# Patient Record
Sex: Female | Born: 1945 | Race: White | Hispanic: No | Smoking: Former smoker
Health system: Southern US, Community
[De-identification: ages and names within clinical notes are randomized; demographics above are authoritative.]

## PROBLEM LIST (undated history)

## (undated) DIAGNOSIS — R609 Edema, unspecified: Secondary | ICD-10-CM

## (undated) DIAGNOSIS — F0390 Unspecified dementia without behavioral disturbance: Secondary | ICD-10-CM

## (undated) DIAGNOSIS — Z8719 Personal history of other diseases of the digestive system: Secondary | ICD-10-CM

## (undated) DIAGNOSIS — M810 Age-related osteoporosis without current pathological fracture: Secondary | ICD-10-CM

## (undated) DIAGNOSIS — Z860100 Personal history of colon polyps, unspecified: Secondary | ICD-10-CM

## (undated) DIAGNOSIS — E039 Hypothyroidism, unspecified: Secondary | ICD-10-CM

## (undated) DIAGNOSIS — F329 Major depressive disorder, single episode, unspecified: Secondary | ICD-10-CM

## (undated) DIAGNOSIS — E785 Hyperlipidemia, unspecified: Secondary | ICD-10-CM

## (undated) DIAGNOSIS — Z733 Stress, not elsewhere classified: Secondary | ICD-10-CM

## (undated) DIAGNOSIS — F3289 Other specified depressive episodes: Secondary | ICD-10-CM

## (undated) HISTORY — DX: Major depressive disorder, single episode, unspecified: F32.9

## (undated) HISTORY — PX: TONSILLECTOMY: SUR1361

## (undated) HISTORY — DX: Other specified depressive episodes: F32.89

## (undated) HISTORY — DX: Personal history of other diseases of the digestive system: Z87.19

## (undated) HISTORY — PX: TUBAL LIGATION: SHX77

## (undated) HISTORY — DX: Edema, unspecified: R60.9

## (undated) HISTORY — DX: Stress, not elsewhere classified: Z73.3

## (undated) HISTORY — DX: Personal history of colon polyps, unspecified: Z86.0100

## (undated) HISTORY — DX: Hypothyroidism, unspecified: E03.9

## (undated) HISTORY — DX: Age-related osteoporosis without current pathological fracture: M81.0

## (undated) HISTORY — PX: HERNIA REPAIR: SHX51

## (undated) HISTORY — DX: Personal history of colonic polyps: Z86.010

## (undated) HISTORY — DX: Hyperlipidemia, unspecified: E78.5

---

## 2004-10-19 ENCOUNTER — Ambulatory Visit: Payer: Self-pay

## 2004-12-07 ENCOUNTER — Ambulatory Visit: Payer: Self-pay

## 2005-08-04 ENCOUNTER — Ambulatory Visit: Payer: Self-pay | Admitting: Unknown Physician Specialty

## 2006-01-24 ENCOUNTER — Ambulatory Visit: Payer: Self-pay

## 2007-05-13 ENCOUNTER — Ambulatory Visit: Payer: Self-pay

## 2008-06-03 ENCOUNTER — Ambulatory Visit: Payer: Self-pay

## 2009-02-07 ENCOUNTER — Ambulatory Visit: Payer: Self-pay | Admitting: Unknown Physician Specialty

## 2011-08-15 ENCOUNTER — Encounter: Payer: Self-pay | Admitting: *Deleted

## 2011-08-16 ENCOUNTER — Encounter: Payer: Self-pay | Admitting: Cardiovascular Disease

## 2011-08-16 ENCOUNTER — Ambulatory Visit (INDEPENDENT_AMBULATORY_CARE_PROVIDER_SITE_OTHER): Payer: PRIVATE HEALTH INSURANCE | Admitting: Cardiovascular Disease

## 2011-08-16 DIAGNOSIS — R079 Chest pain, unspecified: Secondary | ICD-10-CM | POA: Insufficient documentation

## 2011-08-16 DIAGNOSIS — I498 Other specified cardiac arrhythmias: Secondary | ICD-10-CM

## 2011-08-16 DIAGNOSIS — E785 Hyperlipidemia, unspecified: Secondary | ICD-10-CM | POA: Insufficient documentation

## 2011-08-16 DIAGNOSIS — R001 Bradycardia, unspecified: Secondary | ICD-10-CM | POA: Insufficient documentation
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
## 2011-08-16 NOTE — Assessment & Plan Note (Signed)
Heart rate was low on EKG with rate 47 beats per minute. She is asymptomatic. No further workup needed for bradycardia. Rate did improve with movement and sitting up.

## 2011-08-16 NOTE — Assessment & Plan Note (Signed)
Chest pain is atypical in nature. She is very active otherwise with no reproducible symptoms. We have offered routine treadmill study. With the Easter weekend coming up, she will call us if she has additional symptoms and would like the treadmill test. We have suggested if her symptoms resolve and she is active with no further symptoms, she could call us if they recur and we would order this study at her convenience.   Otherwise EKG is normal apart from bradycardia, clinical exam is benign. No echocardiogram has been ordered as she is otherwise doing well. We have encouraged her to continue her regular exercise for weight loss.

## 2011-08-16 NOTE — Patient Instructions (Addendum)
You are doing well. No medication changes were made.  Please call the office if you have worsening chest pain. We would do a treadmill ekg study  Please call us if you have new issues that need to be addressed before your next appt.  Your physician wants you to follow-up in: 12 months.  You will receive a reminder letter in the mail two months in advance. If you don't receive a letter, please call our office to schedule the follow-up appointment.

## 2011-08-16 NOTE — Assessment & Plan Note (Signed)
We have encouraged her to continue on her pravastatin, increase her exercise for weight loss.

## 2011-09-18 ENCOUNTER — Encounter: Payer: Self-pay | Admitting: Cardiovascular Disease

## 2012-09-04 ENCOUNTER — Encounter: Payer: Self-pay | Admitting: Cardiovascular Disease

## 2012-09-04 ENCOUNTER — Ambulatory Visit (INDEPENDENT_AMBULATORY_CARE_PROVIDER_SITE_OTHER): Payer: BC Managed Care – PPO | Admitting: Cardiovascular Disease

## 2012-09-04 VITALS — BP 118/70 | HR 55 | Ht 66.0 in | Wt 179.2 lb

## 2012-09-04 DIAGNOSIS — R079 Chest pain, unspecified: Secondary | ICD-10-CM

## 2012-09-04 DIAGNOSIS — R001 Bradycardia, unspecified: Secondary | ICD-10-CM

## 2012-09-04 DIAGNOSIS — I498 Other specified cardiac arrhythmias: Secondary | ICD-10-CM

## 2012-09-04 DIAGNOSIS — E785 Hyperlipidemia, unspecified: Secondary | ICD-10-CM

## 2012-09-04 NOTE — Patient Instructions (Addendum)
You are doing well. No medication changes were made.  Please call if you have any symptoms such as worsening chest pain,  concerning for cardiac disease  Please call us if you have new issues that need to be addressed before your next appt.
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
## 2012-09-04 NOTE — Assessment & Plan Note (Addendum)
Atypical with markedly reduced frequency. Noted to be positional and self-limiting shortly after waking in the mornings. These episodes are now rare. She is still active around the house and able to complete her ADLs without incident. There are nonspecific ST changes on EKG which were present 1 year ago. Not interested in ETT. Will continue to monitor. She will call the office if chest pain becomes more frequent or worsens. Continue low-dose ASA and statin. She denies reflux-type symptoms. Agreed that stopping antacids or only taking as needed for indigestion is appropriate. She will continue to monitor her symptoms.

## 2012-09-04 NOTE — Assessment & Plan Note (Addendum)
Continues to take pravastatin 40mg  with good lipid control. Would like to receive last lipid panel drawn at PCP's office to review. She is tolerating this medication well. If cholesterol is as low as she believes it is, approximately 130, perhaps she could take half her pravastatin given no known coronary artery disease.

## 2012-09-04 NOTE — Assessment & Plan Note (Addendum)
Asymptomatic with rate 50s in the office today. Stable since last visit.
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
## 2012-09-05 ENCOUNTER — Ambulatory Visit: Payer: Self-pay | Admitting: Psychiatry

## 2012-09-05 LAB — CREATININE, SERUM
EGFR (African American): >60
EGFR (Non-African Amer.): >60

## 2017-04-05 ENCOUNTER — Other Ambulatory Visit: Payer: Self-pay | Admitting: Family Medicine

## 2019-03-26 ENCOUNTER — Emergency Department: Payer: Medicare Other

## 2019-03-26 ENCOUNTER — Other Ambulatory Visit: Payer: Self-pay

## 2019-03-26 ENCOUNTER — Emergency Department
Admission: EM | Admit: 2019-03-26 | Discharge: 2019-03-27 | Disposition: A | Discharge status: Home / Self Care | Payer: Medicare Other | Attending: Emergency Medicine | Admitting: Emergency Medicine

## 2019-03-26 DIAGNOSIS — Z20828 Contact with and (suspected) exposure to other viral communicable diseases: Secondary | ICD-10-CM | POA: Diagnosis not present

## 2019-03-26 DIAGNOSIS — R001 Bradycardia, unspecified: Secondary | ICD-10-CM | POA: Diagnosis present

## 2019-03-26 DIAGNOSIS — Z79899 Other long term (current) drug therapy: Secondary | ICD-10-CM | POA: Diagnosis not present

## 2019-03-26 DIAGNOSIS — F0390 Unspecified dementia without behavioral disturbance: Secondary | ICD-10-CM | POA: Diagnosis present

## 2019-03-26 DIAGNOSIS — Z7982 Long term (current) use of aspirin: Secondary | ICD-10-CM | POA: Insufficient documentation

## 2019-03-26 DIAGNOSIS — Z87891 Personal history of nicotine dependence: Secondary | ICD-10-CM | POA: Diagnosis not present

## 2019-03-26 DIAGNOSIS — R4182 Altered mental status, unspecified: Secondary | ICD-10-CM | POA: Diagnosis present

## 2019-03-26 DIAGNOSIS — E785 Hyperlipidemia, unspecified: Secondary | ICD-10-CM | POA: Diagnosis present

## 2019-03-26 DIAGNOSIS — R079 Chest pain, unspecified: Secondary | ICD-10-CM | POA: Diagnosis present

## 2019-03-26 HISTORY — DX: Unspecified dementia, unspecified severity, without behavioral disturbance, psychotic disturbance, mood disturbance, and anxiety: F03.90

## 2019-03-26 LAB — CBC WITH DIFFERENTIAL/PLATELET
Abs Immature Granulocytes: 0.04 10*3/uL (ref 0.00–0.07)
Basophils Absolute: 0.1 10*3/uL (ref 0.0–0.1)
Basophils Relative: 1 %
Eosinophils Absolute: 0.1 10*3/uL (ref 0.0–0.5)
Eosinophils Relative: 1 %
HCT: 42.8 % (ref 36.0–46.0)
Hemoglobin: 14.5 g/dL (ref 12.0–15.0)
Immature Granulocytes: 0 %
Lymphocytes Relative: 28 %
Lymphs Abs: 2.5 10*3/uL (ref 0.7–4.0)
MCH: 30.2 pg (ref 26.0–34.0)
MCHC: 33.9 g/dL (ref 30.0–36.0)
MCV: 89.2 fL (ref 80.0–100.0)
Monocytes Absolute: 1.6 10*3/uL — ABNORMAL HIGH (ref 0.1–1.0)
Monocytes Relative: 18 %
Neutro Abs: 4.7 10*3/uL (ref 1.7–7.7)
Neutrophils Relative %: 52 %
Platelets: 138 10*3/uL — ABNORMAL LOW (ref 150–400)
RBC: 4.8 MIL/uL (ref 3.87–5.11)
RDW: 11.7 % (ref 11.5–15.5)
WBC: 8.9 10*3/uL (ref 4.0–10.5)
nRBC: 0 % (ref 0.0–0.2)

## 2019-03-26 LAB — URINALYSIS, COMPLETE (UACMP) WITH MICROSCOPIC
Bacteria, UA: NONE SEEN
Bilirubin Urine: NEGATIVE
Glucose, UA: NEGATIVE mg/dL
Hgb urine dipstick: NEGATIVE
Ketones, ur: 20 mg/dL — AB
Leukocytes,Ua: NEGATIVE
Nitrite: NEGATIVE
Protein, ur: NEGATIVE mg/dL
Specific Gravity, Urine: 1.023 (ref 1.005–1.030)
pH: 7 (ref 5.0–8.0)

## 2019-03-26 LAB — COMPREHENSIVE METABOLIC PANEL
ALT: 22 U/L (ref 0–44)
AST: 27 U/L (ref 15–41)
Albumin: 3.4 g/dL — ABNORMAL LOW (ref 3.5–5.0)
Alkaline Phosphatase: 49 U/L (ref 38–126)
Anion gap: 8 (ref 5–15)
BUN: 13 mg/dL (ref 8–23)
CO2: 26 mmol/L (ref 22–32)
Calcium: 8.5 mg/dL — ABNORMAL LOW (ref 8.9–10.3)
Chloride: 106 mmol/L (ref 98–111)
Creatinine, Ser: 0.69 mg/dL (ref 0.44–1.00)
GFR calc Af Amer: >60 mL/min (ref >60)
GFR calc non Af Amer: >60 mL/min (ref >60)
Glucose, Bld: 109 mg/dL — ABNORMAL HIGH (ref 70–99)
Potassium: 3.5 mmol/L (ref 3.5–5.1)
Sodium: 140 mmol/L (ref 135–145)
Total Bilirubin: 1.2 mg/dL (ref 0.3–1.2)
Total Protein: 6.7 g/dL (ref 6.5–8.1)

## 2019-03-26 MED ORDER — SODIUM CHLORIDE 0.9 % IV BOLUS
250.0000 mL | Freq: Once | INTRAVENOUS | Status: AC
  Administered 2019-03-27: 250 mL via INTRAVENOUS

## 2019-03-26 NOTE — ED Notes (Signed)
Pt placed on 2L via [place] bc she ranges from 88% to 100% on RA; pt fluctuates constantly.

## 2019-03-26 NOTE — ED Provider Notes (Signed)
[facility] Emergency Department Provider Note    First MD Initiated Contact with Patient 03/26/19 2059     (approximate)  I have reviewed the triage vital signs and the nursing notes.   HISTORY  Chief Complaint Weakness  Level V Caveat:  AMS  HPI Kristina Brady is a 73 y.o. female as well as a past medical history presents to the ER for evaluation of altered mental status and reported fall.  Reportedly had 1 ground-level fall at [facility] today.  Reportedly is having more frequent falls.  Somewhat uncooperative with exam today.  No reported fevers.  No report of headache.  No nausea or vomiting. Family reports change in mentation over the past several days.  Presents from [facility].  Patient a very poor historian.   Past Medical History:  Diagnosis Date  . Chest pain   . Dementia (HCC)   . Depressive disorder, not elsewhere classified     Dr Imogene Burnhen  . Edema    hands/fingers on occasion  . History of inguinal hernia    right  . Osteoporosis, unspecified   . Other and unspecified hyperlipidemia   . Other psychological or physical stress, not elsewhere classified(V62.89)    Dr Imogene Burnhen  . Personal history of colonic polyps    Dr Mechele CollinElliott  . Unspecified hypothyroidism   . Unspecified hypothyroidism    Family History  Problem Relation Age of Onset  . Heart failure Father        congestive  . Colon cancer Sister   . Hypertension Sister    Past Surgical History:  Procedure Laterality Date  . HERNIA REPAIR    . TONSILLECTOMY    . TUBAL LIGATION     Patient Active Problem List   Diagnosis Date Noted  . Chest pain 08/16/2011  . Hyperlipidemia 08/16/2011  . Bradycardia 08/16/2011      Prior to Admission medications   Medication Sig Start Date End Date Taking? Authorizing Provider  aspirin 81 MG tablet Take 81 mg by mouth daily.    [provider]  Calcium Carbonate-Vitamin D (CALCIUM 600+D) 600-200 MG-UNIT TABS Take by mouth.     [provider]  calcium elemental as carbonate (TUMS ULTRA 1000) 400 MG tablet Chew 1,000 mg by mouth as needed.    [provider]  Cholecalciferol (VITAMIN D3) 400 UNITS CAPS Take 1 capsule by mouth daily.    [provider]  diphenhydrAMINE (BENADRYL) 50 MG capsule Take 50 mg by mouth every 6 (six) hours as needed.    [provider]  docusate sodium (COLACE) 100 MG capsule Take 100 mg by mouth 2 (two) times daily.    [provider]  levothyroxine (SYNTHROID, LEVOTHROID) 88 MCG tablet Take 88 mcg by mouth daily.    [provider]  Multiple Vitamins-Minerals (CENTRUM SILVER PO) Take by mouth.    [provider]  pravastatin (PRAVACHOL) 40 MG tablet Take 40 mg by mouth daily.    [provider]  trazodone (DESYREL) 300 MG tablet Take 300 mg by mouth at bedtime.    [provider]    Allergies Ciprocin-fluocin-procin [fluocinolone acetonide], Clarithromycin, Macrolides and ketolides, and Penicillins    Social History Social History   Tobacco Use  . Smoking status: Former Smoker    Types: Cigarettes    Quit date: 05/21/1978    Years since quitting: 40.8  . Smokeless tobacco: Never Used  Substance Use Topics  . Alcohol use: No  . Drug  use: No    Review of Systems Patient denies headaches, rhinorrhea, blurry vision, numbness, shortness of breath, chest pain, edema, cough, abdominal pain, nausea, vomiting, diarrhea, dysuria, fevers, rashes or hallucinations unless otherwise stated above in HPI. ____________________________________________   PHYSICAL EXAM:  VITAL SIGNS: Vitals:   03/26/19 2345 03/27/19 0000  BP:  108/69  Pulse: 63 64  Resp: 15 14  Temp:    SpO2: 96% 95%    Constitutional: Alert, protecting her airway, poor historian Eyes: Conjunctivae are normal.  Head: Atraumatic. Nose: No congestion/rhinnorhea. Mouth/Throat: Mucous membranes are moist.   Neck: No stridor. Painless ROM.   Cardiovascular: Normal rate, regular rhythm. Grossly normal heart sounds.  Good peripheral circulation. Respiratory: Normal respiratory effort.  No retractions. Lungs CTAB. Gastrointestinal: Soft and nontender. No distention. No abdominal bruits. No CVA tenderness. Genitourinary: deferred Musculoskeletal: No lower extremity tenderness nor edema.  No joint effusions. Neurologic:  MAE spontaneously. No gross focal neurologic deficits are appreciated. No facial droop Skin:  Skin is warm, dry and intact. No rash noted. Psychiatric: withdrawn ____________________________________________   LABS (all labs ordered are listed, but only abnormal results are displayed)  Results for orders placed or performed during the hospital encounter of 03/26/19 (from the past 24 hour(s))  CBC with Differential     Status: Abnormal   Collection Time: 03/26/19 10:04 PM  Result Value Ref Range   WBC 8.9 4.0 - 10.5 K/uL   RBC 4.80 3.87 - 5.11 MIL/uL   Hemoglobin 14.5 12.0 - 15.0 g/dL   HCT 42.8 36.0 - 46.0 %   MCV 89.2 80.0 - 100.0 fL   MCH 30.2 26.0 - 34.0 pg   MCHC 33.9 30.0 - 36.0 g/dL   RDW 11.7 11.5 - 15.5 %   Platelets 138 (L) 150 - 400 K/uL   nRBC 0.0 0.0 - 0.2 %   Neutrophils Relative % 52 %   Neutro Abs 4.7 1.7 - 7.7 K/uL   Lymphocytes Relative 28 %   Lymphs Abs 2.5 0.7 - 4.0 K/uL   Monocytes Relative 18 %   Monocytes Absolute 1.6 (H) 0.1 - 1.0 K/uL   Eosinophils Relative 1 %   Eosinophils Absolute 0.1 0.0 - 0.5 K/uL   Basophils Relative 1 %   Basophils Absolute 0.1 0.0 - 0.1 K/uL   Immature Granulocytes 0 %   Abs Immature Granulocytes 0.04 0.00 - 0.07 K/uL  Comprehensive metabolic panel     Status: Abnormal   Collection Time: 03/26/19 10:04 PM  Result Value Ref Range   Sodium 140 135 - 145 mmol/L   Potassium 3.5 3.5 - 5.1 mmol/L   Chloride 106 98 - 111 mmol/L   CO2 26 22 - 32 mmol/L   Glucose, Bld 109 (H) 70 - 99 mg/dL   BUN 13 8 - 23 mg/dL   Creatinine, Ser 0.69 0.44 - 1.00 mg/dL    Calcium 8.5 (L) 8.9 - 10.3 mg/dL   Total Protein 6.7 6.5 - 8.1 g/dL   Albumin 3.4 (L) 3.5 - 5.0 g/dL   AST 27 15 - 41 U/L   ALT 22 0 - 44 U/L   Alkaline Phosphatase 49 38 - 126 U/L   Total Bilirubin 1.2 0.3 - 1.2 mg/dL   GFR calc non Af Amer >60 >60 mL/min   GFR calc Af Amer >60 >60 mL/min   Anion gap 8 5 - 15  Urinalysis, Complete w Microscopic     Status: Abnormal   Collection Time: 03/26/19 10:04 PM  Result  Value Ref Range   Color, Urine YELLOW (A) YELLOW   APPearance CLEAR (A) CLEAR   Specific Gravity, Urine 1.023 1.005 - 1.030   pH 7.0 5.0 - 8.0   Glucose, UA NEGATIVE NEGATIVE mg/dL   Hgb urine dipstick NEGATIVE NEGATIVE   Bilirubin Urine NEGATIVE NEGATIVE   Ketones, ur 20 (A) NEGATIVE mg/dL   Protein, ur NEGATIVE NEGATIVE mg/dL   Nitrite NEGATIVE NEGATIVE   Leukocytes,Ua NEGATIVE NEGATIVE   RBC / HPF 0-5 0 - 5 RBC/hpf   WBC, UA 0-5 0 - 5 WBC/hpf   Bacteria, UA NONE SEEN NONE SEEN   Squamous Epithelial / LPF 0-5 0 - 5   ____________________________________________  EKG My review and personal interpretation at Time: 21:02   Indication: ams  Rate: 70  Rhythm: sinus Axis: normal Other: normal intervals, no stemi, nonspecific st abn ____________________________________________  RADIOLOGY  I personally reviewed all radiographic images ordered to evaluate for the above acute complaints and reviewed radiology reports and findings.  These findings were personally discussed with the patient.  Please see medical record for radiology report.  ____________________________________________   PROCEDURES  Procedure(s) performed:  Procedures    Critical Care performed: no ____________________________________________   INITIAL IMPRESSION / ASSESSMENT AND PLAN / ED COURSE  Pertinent labs & imaging results that were available during my care of the patient were reviewed by me and considered in my medical decision making (see chart for details).   DDX: Dehydration, sepsis,  pna, uti, hypoglycemia, cva, drug effect, withdrawal,    Kristina Brady is a 73 y.o. who presents to the ED with altered mental status described above.  CT imaging will be ordered due to concern for recent falls also evaluate for any evidence of mass bleed or stroke.  Patient uncertain last known normal.  Blood will be sent on for above differential.  Patient also with history of psychiatric illness.  After work-up here in the ER is stable this may be manifestation of underlying psychiatric illness.  Will consult psychiatry.  The patient will be placed on continuous pulse oximetry and telemetry for monitoring.  Laboratory evaluation will be sent to evaluate for the above complaints.        The patient was evaluated in Emergency Department today for the symptoms described in the history of present illness. He/she was evaluated in the context of the global COVID-19 pandemic, which necessitated consideration that the patient might be at risk for infection with the SARS-CoV-2 virus that causes COVID-19. Institutional protocols and algorithms that pertain to the evaluation of patients at risk for COVID-19 are in a state of rapid change based on information released by regulatory bodies including the CDC and federal and state organizations. These policies and algorithms were followed during the patient's care in the ED.  As part of my medical decision making, I reviewed the following data within the electronic MEDICAL RECORD NUMBER Nursing notes reviewed and incorporated, Labs reviewed, notes from prior ED visits and [place] Controlled Substance Database   ____________________________________________   FINAL CLINICAL IMPRESSION(S) / ED DIAGNOSES  Final diagnoses:  Altered mental status, unspecified altered mental status type      NEW MEDICATIONS STARTED DURING THIS VISIT:  New Prescriptions   No medications on file     Note:  This document was prepared using Dragon voice recognition software and may  include unintentional dictation errors.    Willy Eddy, MD 03/27/19 0005

## 2019-03-26 NOTE — ED Triage Notes (Signed)
Pt in via EMS from [place] with weakness. Staff reports falls in past weeks but none today. Reports pt got up to go to restroom and had more weakness in legs than usual. BG 132; temp 98.7; 132/78 BP with EMS. History of dementia. Pt alert and calm.

## 2019-03-26 NOTE — ED Notes (Signed)
Pt maintains 99% on 2L.

## 2019-03-26 NOTE — ED Notes (Signed)
This RN to collect bloodwork once pt back from imaging. Visitor in lobby being screened to come visit pt.

## 2019-03-26 NOTE — ED Notes (Addendum)
Alissa NT to assist this RN to collect urine sample via I&O cath momentarily. Family remains at bedside.

## 2019-03-27 ENCOUNTER — Emergency Department: Payer: Medicare Other

## 2019-03-27 DIAGNOSIS — F0390 Unspecified dementia without behavioral disturbance: Secondary | ICD-10-CM | POA: Diagnosis present

## 2019-03-27 DIAGNOSIS — R4182 Altered mental status, unspecified: Secondary | ICD-10-CM | POA: Diagnosis not present

## 2019-03-27 DIAGNOSIS — F0151 Vascular dementia with behavioral disturbance: Secondary | ICD-10-CM

## 2019-03-27 DIAGNOSIS — R401 Stupor: Secondary | ICD-10-CM

## 2019-03-27 LAB — SARS CORONAVIRUS 2 (TAT 6-24 HRS): SARS Coronavirus 2: NEGATIVE

## 2019-03-27 MED ORDER — LORAZEPAM 2 MG/ML IJ SOLN
0.5000 mg | Freq: Once | INTRAMUSCULAR | Status: AC
  Administered 2019-03-27: 02:00:00 0.5 mg via INTRAVENOUS

## 2019-03-27 MED ORDER — LORAZEPAM 2 MG/ML IJ SOLN
INTRAMUSCULAR | Status: AC
  Filled 2019-03-27: qty 1

## 2019-03-27 NOTE — ED Notes (Signed)
EMS to transport patient.  This RN called [facility] to give report.  Per Tiffany at [place], patient is able to ambulate steadily and feed herself and was able to do these tasks yesterday.  This RN notified physician and he decided to wait and continue evaluation of patient prior to transport of patient.

## 2019-03-27 NOTE — ED Notes (Addendum)
Attempted to contact patient's niece but there was no answer. Patient will be going back to [place] upon discharge. Patient is sleeping and remained sleeping while covers were adjusted and railings put up.

## 2019-03-27 NOTE — ED Notes (Addendum)
Patient is resting in hospital bed, appears comfortable. Physical therapist aware of need for consult.

## 2019-03-27 NOTE — Consult Note (Signed)
[facility] Face-to-Face Psychiatry Consult   Reason for Consult: Weakness Referring Physician: Dr. Roxan Hockeyobinson Patient Identification: Kristina Brady MRN:  [id number] Principal Diagnosis: Altered mental status Diagnosis:  Principal Problem:   Altered mental status Active Problems:   Chest pain   Hyperlipidemia   Bradycardia   Dementia (HCC)   Total Time spent with patient: 1 hour  Subjective: Nonverbal Kristina Daceolly A [place] is a 73 y.o. female patient [facility] ED via EMS from [facility] facility with weakness and altered mental status (AMS). The patient has had several falls due to the progression of her dementia per her niece Ms. Bernadene BellLisa Parrott [phone]).  Per the patient niece, she last saw her a couple of months ago. During that time, she was ambulating well and communicating slightly more than what she is doing now.  She states her aunt knew her and would make some references to topics they discuss during their conversation.   The patient was seen face-to-face by this provider; chart reviewed and consulted with Dr. Manson PasseyBrown on 03/27/2019 due to the patient's care. It was discussed with the EDP that the patient does not meet criteria to be admitted to the geriatrics psychiatric inpatient unit.  On evaluation, the patient is alert and agitated, cooperative, and mood-congruent with affect. The patient cannot be assessed due to her being nonverbal and not responding to all questions presented to her.  The patient niece discussed that she has dementia, and per her primary doctor, the disease is progressing.  Plan: The patient is not a safety risk to self or others and does not require geriatric psychiatric inpatient admission for stabilization and treatment.  HPI: Per Dr. 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  Reportedly had 1 ground-level fall at [facility] today.  Reportedly is  having more frequent falls.  Somewhat uncooperative with exam today.  No reported fevers.  No report of headache.  No nausea or vomiting.Family reports change in mentation over the past several days.  Presents from [facility].  Patient a very poor historian.  Past Psychiatric History:  Dementia (HCC) Depressive disorder, not elsewhere classified  Risk to Self:  No Risk to Others:  No Prior Inpatient Therapy:  No Prior Outpatient Therapy:  No  Past Medical History:  Past Medical History:  Diagnosis Date  . Chest pain   . Dementia (HCC)   . Depressive disorder, not elsewhere classified     Dr Imogene Burnhen  . Edema    hands/fingers on occasion  . History of inguinal hernia    right  . Osteoporosis, unspecified   . Other and unspecified hyperlipidemia   . Other psychological or physical stress, not elsewhere classified(V62.89)    Dr Imogene Burnhen  . Personal history of colonic polyps    Dr Mechele CollinElliott  . Unspecified hypothyroidism   . Unspecified hypothyroidism     Past Surgical History:  Procedure Laterality Date  . HERNIA REPAIR    . TONSILLECTOMY    . TUBAL LIGATION     Family History:  Family History  Problem Relation Age of Onset  . Heart failure Father        congestive  . Colon cancer Sister   . Hypertension Sister    Family Psychiatric  History:  Social History:  Social History   Substance and Sexual Activity  Alcohol Use No     Social History  Substance and Sexual Activity  Drug Use No    Social History   Socioeconomic History  . Marital status: Widowed    Spouse name: Not on file  . Number of children: Not on file  . Years of education: Not on file  . Highest education level: Not on file  Occupational History  . Not on file  Social Needs  . Financial resource strain: Not on file  . Food insecurity    Worry: Not on file    Inability: Not on file  . Transportation needs    Medical: Not on file    Non-medical: Not on file  Tobacco Use  . Smoking status:  Former Smoker    Types: Cigarettes    Quit date: 05/21/1978    Years since quitting: 40.8  . Smokeless tobacco: Never Used  Substance and Sexual Activity  . Alcohol use: No  . Drug use: No  . Sexual activity: Not on file  Lifestyle  . Physical activity    Days per week: Not on file    Minutes per session: Not on file  . Stress: Not on file  Relationships  . Social Herbalist on phone: Not on file    Gets together: Not on file    Attends religious service: Not on file    Active member of club or organization: Not on file    Attends meetings of clubs or organizations: Not on file    Relationship status: Not on file  Other Topics Concern  . Not on file  Social History Narrative  . Not on file   Additional Social History:    Allergies:   Allergies  Allergen Reactions  . Ciprocin-Fluocin-Procin [Fluocinolone Acetonide]   . Clarithromycin   . Macrolides And Ketolides   . Penicillins     Labs:  Results for orders placed or performed during the hospital encounter of 03/26/19 (from the past 48 hour(s))  CBC with Differential     Status: Abnormal   Collection Time: 03/26/19 10:04 PM  Result Value Ref Range   WBC 8.9 4.0 - 10.5 K/uL   RBC 4.80 3.87 - 5.11 MIL/uL   Hemoglobin 14.5 12.0 - 15.0 g/dL   HCT 42.8 36.0 - 46.0 %   MCV 89.2 80.0 - 100.0 fL   MCH 30.2 26.0 - 34.0 pg   MCHC 33.9 30.0 - 36.0 g/dL   RDW 11.7 11.5 - 15.5 %   Platelets 138 (L) 150 - 400 K/uL   nRBC 0.0 0.0 - 0.2 %   Neutrophils Relative % 52 %   Neutro Abs 4.7 1.7 - 7.7 K/uL   Lymphocytes Relative 28 %   Lymphs Abs 2.5 0.7 - 4.0 K/uL   Monocytes Relative 18 %   Monocytes Absolute 1.6 (H) 0.1 - 1.0 K/uL   Eosinophils Relative 1 %   Eosinophils Absolute 0.1 0.0 - 0.5 K/uL   Basophils Relative 1 %   Basophils Absolute 0.1 0.0 - 0.1 K/uL   Immature Granulocytes 0 %   Abs Immature Granulocytes 0.04 0.00 - 0.07 K/uL    Comment: Performed at [facility], [address]  [place], [place] [address]  Comprehensive metabolic panel     Status: Abnormal   Collection Time: 03/26/19 10:04 PM  Result Value Ref Range   Sodium 140 135 - 145 mmol/L   Potassium 3.5 3.5 - 5.1 mmol/L   Chloride 106 98 - 111 mmol/L   CO2 26 22 - 32  mmol/L   Glucose, Bld 109 (H) 70 - 99 mg/dL   BUN 13 8 - 23 mg/dL   Creatinine, Ser 1.61 0.44 - 1.00 mg/dL   Calcium 8.5 (L) 8.9 - 10.3 mg/dL   Total Protein 6.7 6.5 - 8.1 g/dL   Albumin 3.4 (L) 3.5 - 5.0 g/dL   AST 27 15 - 41 U/L   ALT 22 0 - 44 U/L   Alkaline Phosphatase 49 38 - 126 U/L   Total Bilirubin 1.2 0.3 - 1.2 mg/dL   GFR calc non Af Amer >60 >60 mL/min   GFR calc Af Amer >60 >60 mL/min   Anion gap 8 5 - 15    Comment: Performed at [facility], [address] [place], [place] [address]  Urinalysis, Complete w Microscopic     Status: Abnormal   Collection Time: 03/26/19 10:04 PM  Result Value Ref Range   Color, Urine YELLOW (A) YELLOW   APPearance CLEAR (A) CLEAR   Specific Gravity, Urine 1.023 1.005 - 1.030   pH 7.0 5.0 - 8.0   Glucose, UA NEGATIVE NEGATIVE mg/dL   Hgb urine dipstick NEGATIVE NEGATIVE   Bilirubin Urine NEGATIVE NEGATIVE   Ketones, ur 20 (A) NEGATIVE mg/dL   Protein, ur NEGATIVE NEGATIVE mg/dL   Nitrite NEGATIVE NEGATIVE   Leukocytes,Ua NEGATIVE NEGATIVE   RBC / HPF 0-5 0 - 5 RBC/hpf   WBC, UA 0-5 0 - 5 WBC/hpf   Bacteria, UA NONE SEEN NONE SEEN   Squamous Epithelial / LPF 0-5 0 - 5    Comment: Performed at [facility], [address] [place], [place] [address]    Current Facility-Administered Medications  Medication Dose Route Frequency Provider Last Rate Last Dose  . LORazepam (ATIVAN) 2 MG/ML injection            Current Outpatient Medications  Medication Sig Dispense Refill  . aspirin 81 MG tablet Take 81 mg by mouth daily.    . Calcium Carbonate-Vitamin D (CALCIUM 600+D) 600-200 MG-UNIT TABS Take by mouth.    . calcium elemental as carbonate (TUMS ULTRA 1000) 400 MG  tablet Chew 1,000 mg by mouth as needed.    . Cholecalciferol (VITAMIN D3) 400 UNITS CAPS Take 1 capsule by mouth daily.    . diphenhydrAMINE (BENADRYL) 50 MG capsule Take 50 mg by mouth every 6 (six) hours as needed.    . docusate sodium (COLACE) 100 MG capsule Take 100 mg by mouth 2 (two) times daily.    Marland Kitchen levothyroxine (SYNTHROID, LEVOTHROID) 88 MCG tablet Take 88 mcg by mouth daily.    . Multiple Vitamins-Minerals (CENTRUM SILVER PO) Take by mouth.    . pravastatin (PRAVACHOL) 40 MG tablet Take 40 mg by mouth daily.    . trazodone (DESYREL) 300 MG tablet Take 300 mg by mouth at bedtime.      Musculoskeletal: Strength & Muscle Tone: decreased Gait & Station: unsteady Patient leans: Backward  Psychiatric Specialty Exam: Physical Exam  Nursing note and vitals reviewed. Constitutional: She appears well-developed.  Neck: Normal range of motion. Neck supple.  Cardiovascular: Normal rate.  Respiratory: Effort normal.  Neurological: She is alert.    Review of Systems  Psychiatric/Behavioral: Positive for memory loss.  All other systems reviewed and are negative.   Blood pressure 90/63, pulse (!) 57, temperature 98.6 F (37 C), temperature source Axillary, resp. rate 14, height  (1.676 m), weight 90.7 kg, SpO2 100 %.Body mass index is 32.28 kg/m.  General Appearance:  Casual  Eye Contact:  None  Speech:  Garbled  Volume:  Increased  Mood:  Angry, Anxious and Irritable  Affect:  NA  Thought Process:  Descriptions of Associations: Loose  Orientation:  Other:  Alert  Thought Content:  Negative  Suicidal Thoughts:  Unable to assess  Homicidal Thoughts:  Unable to assess  Memory:  Immediate;   Unable to assess Recent;   Unable to assess Remote;   Unable to assess  Judgement:  Impaired  Insight:  Unable to assess  Psychomotor Activity:  Increased  Concentration:  Concentration: Unable to assess and Attention Span: Unable to assess  Recall:  Poor  Fund of Knowledge:  Unable  to assess  Language:  Poor  Akathisia:  Negative  Handed:  Right  AIMS (if indicated):     Assets:  Manufacturing systems engineer Physical Health Social Support Vocational/Educational  ADL's:  Impaired  Cognition:  Impaired,  Severe  Sleep:        Treatment Plan Summary: Plan Patient does not meet criteria for geriatric psychiatric inpatient admission.  Disposition: No evidence of imminent risk to self or others at present.   Patient does not meet criteria for psychiatric inpatient admission. Supportive therapy provided about ongoing stressors.  Gillermo Murdoch, NP 03/27/2019 5:58 AM

## 2019-03-27 NOTE — ED Notes (Signed)
Pt back from MRI 

## 2019-03-27 NOTE — Care Management (Signed)
ED RN CM call to niece Ms. Urban Gibson @ [phone] confirmed patient is discharging back to [facility] today via EMS.

## 2019-03-27 NOTE — ED Notes (Signed)
Pt snoring lightly. Chest rise and fall visible. Lights dimmed. Door open. Bed locked low. Rails up. Call bell within reach.

## 2019-03-27 NOTE — ED Notes (Addendum)
Pt placed on 2L via [place] again. Pt does not maintain sats above 91-93% while sleeping.

## 2019-03-27 NOTE — ED Notes (Signed)
Psych still talking with pt/family.

## 2019-03-27 NOTE — ED Notes (Signed)
This RN discussed plan of care with Tiffany at [facility] who asked for orders for PT and OT at [facility] to work to bring patient up to her baseline level.

## 2019-03-27 NOTE — ED Provider Notes (Signed)
Patient has been cleared to return to the facility from which she came by family and the facility.   Kristina Newport, MD 03/27/19 1144

## 2019-03-27 NOTE — ED Notes (Signed)
Pt to MRI

## 2019-03-27 NOTE — ED Notes (Signed)
This RN fed patient applesauce.  Patient did well.  Will continue to monitor.

## 2019-03-27 NOTE — ED Notes (Signed)
Case manager and Flow coordinator are arranging for transfer and completing medical necessity form.

## 2019-03-27 NOTE — ED Notes (Signed)
This nurse and [name] transferred pt to hospital bed, changed linens and undergarments. Pt eyes closed but responds to stimuli. Pt comfortable and resting, no signs of distress.

## 2019-03-27 NOTE — Care Management (Addendum)
ED RN CM visited room-no family at bedside and staff working with patient. Left voicemail for niece Ms. Urban Gibson @ [phone]. Received call back from Urban Gibson confirmed member will return to [facility] at discharge.  Left VM for Lattie Haw @ [facility]-[phone] requesting callback to discuss discharge plan.  Received call from Romney @ [facility] [phone]  states member is able to return once discharge order completed.

## 2019-03-27 NOTE — Progress Notes (Signed)
PT Cancellation Note  Patient Details Name: ZYANYA GLAZA MRN: [id number] DOB: 05-02-1946   Cancelled Treatment:    Reason Eval/Treat Not Completed: PT screened, no needs identified, will sign off(CHart reviewed; cases discussed with RNCM who reports pt has a DC plan in place that does not warrant PT evaluation at this time.) PT signing off at this time.   12:03 PM, 03/27/19 Etta Grandchild, PT, DPT Physical Therapist - [facility]  [phone] ([place])   Odell C 03/27/2019, 12:02 PM

## 2019-03-27 NOTE — ED Notes (Signed)
[facility] called this RN requesting update. Update given.

## 2019-03-27 NOTE — ED Notes (Signed)
EMS here for transport

## 2019-03-27 NOTE — ED Notes (Signed)
Pt given another warm blanket. Bed remains locked low. Rails remains up. Call bell remains within reach.

## 2019-03-27 NOTE — Progress Notes (Signed)
PT Cancellation Note  Patient Details Name: Kristina Brady MRN: [id number] DOB: 03/07/1946   Cancelled Treatment:    Reason Eval/Treat Not Completed: Fatigue/lethargy limiting ability to participate(Second consult received for patient.  Per secure chat with EDP, consult placed for services in ER, as necessary for patient return to facility.  Evaluation attempted.  Upon arrival to room, patient sleeping soundly; difficult to awaken.  Briefly rouses to sternal rub and deep nail bed pressure, but does not open eyes, attend to therapist.  Unable to participate with session at this time.  EDP informed/aware; EMS at doorway for transport to facility.)   [name] H. Owens Shark, PT, DPT, NCS 03/27/19, 4:03 PM [phone]

## 2019-04-11 ENCOUNTER — Emergency Department
Admission: EM | Admit: 2019-04-11 | Discharge: 2019-04-11 | Disposition: A | Discharge status: Skilled Nursing Facility | Payer: Medicare Other | Attending: Emergency Medicine | Admitting: Emergency Medicine

## 2019-04-11 ENCOUNTER — Emergency Department: Payer: Medicare Other

## 2019-04-11 ENCOUNTER — Other Ambulatory Visit: Payer: Self-pay

## 2019-04-11 DIAGNOSIS — Z87891 Personal history of nicotine dependence: Secondary | ICD-10-CM | POA: Diagnosis not present

## 2019-04-11 DIAGNOSIS — R509 Fever, unspecified: Secondary | ICD-10-CM

## 2019-04-11 DIAGNOSIS — N390 Urinary tract infection, site not specified: Secondary | ICD-10-CM | POA: Diagnosis not present

## 2019-04-11 DIAGNOSIS — Z7982 Long term (current) use of aspirin: Secondary | ICD-10-CM | POA: Diagnosis not present

## 2019-04-11 DIAGNOSIS — Z20828 Contact with and (suspected) exposure to other viral communicable diseases: Secondary | ICD-10-CM | POA: Insufficient documentation

## 2019-04-11 DIAGNOSIS — Z79899 Other long term (current) drug therapy: Secondary | ICD-10-CM | POA: Insufficient documentation

## 2019-04-11 LAB — URINALYSIS, ROUTINE W REFLEX MICROSCOPIC
Bilirubin Urine: NEGATIVE
Glucose, UA: NEGATIVE mg/dL
Hgb urine dipstick: NEGATIVE
Ketones, ur: 5 mg/dL — AB
Nitrite: NEGATIVE
Protein, ur: 30 mg/dL — AB
Specific Gravity, Urine: 1.02 (ref 1.005–1.030)
WBC, UA: >50 WBC/hpf — ABNORMAL HIGH (ref 0–5)
pH: 7 (ref 5.0–8.0)

## 2019-04-11 LAB — CBC WITH DIFFERENTIAL/PLATELET
Abs Immature Granulocytes: 0.02 10*3/uL (ref 0.00–0.07)
Basophils Absolute: 0.1 10*3/uL (ref 0.0–0.1)
Basophils Relative: 1 %
Eosinophils Absolute: 0.1 10*3/uL (ref 0.0–0.5)
Eosinophils Relative: 1 %
HCT: 45.1 % (ref 36.0–46.0)
Hemoglobin: 15.7 g/dL — ABNORMAL HIGH (ref 12.0–15.0)
Immature Granulocytes: 0 %
Lymphocytes Relative: 32 %
Lymphs Abs: 2.7 10*3/uL (ref 0.7–4.0)
MCH: 30.4 pg (ref 26.0–34.0)
MCHC: 34.8 g/dL (ref 30.0–36.0)
MCV: 87.4 fL (ref 80.0–100.0)
Monocytes Absolute: 1.4 10*3/uL — ABNORMAL HIGH (ref 0.1–1.0)
Monocytes Relative: 17 %
Neutro Abs: 4.3 10*3/uL (ref 1.7–7.7)
Neutrophils Relative %: 49 %
Platelets: 136 10*3/uL — ABNORMAL LOW (ref 150–400)
RBC: 5.16 MIL/uL — ABNORMAL HIGH (ref 3.87–5.11)
RDW: 13 % (ref 11.5–15.5)
WBC: 8.6 10*3/uL (ref 4.0–10.5)
nRBC: 0 % (ref 0.0–0.2)

## 2019-04-11 LAB — COMPREHENSIVE METABOLIC PANEL
ALT: 21 U/L (ref 0–44)
AST: 28 U/L (ref 15–41)
Albumin: 3.6 g/dL (ref 3.5–5.0)
Alkaline Phosphatase: 59 U/L (ref 38–126)
Anion gap: 12 (ref 5–15)
BUN: 10 mg/dL (ref 8–23)
CO2: 25 mmol/L (ref 22–32)
Calcium: 8.7 mg/dL — ABNORMAL LOW (ref 8.9–10.3)
Chloride: 100 mmol/L (ref 98–111)
Creatinine, Ser: 0.86 mg/dL (ref 0.44–1.00)
GFR calc Af Amer: >60 mL/min (ref >60)
GFR calc non Af Amer: >60 mL/min (ref >60)
Glucose, Bld: 116 mg/dL — ABNORMAL HIGH (ref 70–99)
Potassium: 3.9 mmol/L (ref 3.5–5.1)
Sodium: 137 mmol/L (ref 135–145)
Total Bilirubin: 1.2 mg/dL (ref 0.3–1.2)
Total Protein: 7.1 g/dL (ref 6.5–8.1)

## 2019-04-11 LAB — LACTIC ACID, PLASMA: Lactic Acid, Venous: 1.5 mmol/L (ref 0.5–1.9)

## 2019-04-11 LAB — LIPASE, BLOOD: Lipase: 32 U/L (ref 11–51)

## 2019-04-11 MED ORDER — FOSFOMYCIN TROMETHAMINE 3 G PO PACK: 3.0000 g | PACK | Freq: Once | ORAL | 0 refills | Status: AC

## 2019-04-11 MED ORDER — FOSFOMYCIN TROMETHAMINE 3 G PO PACK
3.0000 g | PACK | ORAL | Status: AC
  Administered 2019-04-11: 3 g via ORAL
  Filled 2019-04-11: qty 3

## 2019-04-11 NOTE — ED Notes (Signed)
Lenore Manner (administration) at facility agreeable to accept pt back to facility. Will arrange EMS transport.

## 2019-04-11 NOTE — ED Notes (Signed)
Labs redrawn per lab request. Pt status unchanged. Bed alarm on due to high fall risk.

## 2019-04-11 NOTE — ED Notes (Addendum)
Attempted to contact Lattie Haw (niece), left VM. Pending d/c back to facility.

## 2019-04-11 NOTE — ED Triage Notes (Signed)
Pt presents via EMS from [facility] care with c/o fever. EMS reports facility stated fever 102. Pt at baseline mentation per EMS. Pt shaking upon arrival to ED, unsure if normal tremors for patient. Quale MD at bedside.

## 2019-04-11 NOTE — ED Notes (Signed)
Report given to Marchia Meiers, med tech at [facility]. Per staff, has to get acceptance for director to receive patient back to facility.

## 2019-04-11 NOTE — ED Provider Notes (Signed)
[facility] Emergency Department Provider Note   ____________________________________________   First MD Initiated Contact with Patient 04/11/19 [phone]     (approximate)  I have reviewed the triage vital signs and the nursing notes.   HISTORY  Chief Complaint Fever  EM caveat: Patient dementia, very poor historian  HPI Kristina Brady is a 73 y.o. female presents for evaluation for fever   EMS reports that her care facility reported that she had a fever this morning, they could not administer medicine that they had no doctor or nurse on staff today.  She resides in memory care.  Otherwise EMS reports no other symptoms were noted and the facility said that she has been acting at her normal cognitive baseline which includes primarily nonverbal  Patient has no complaint, patient very poor historian however  Past Medical History:  Diagnosis Date  . Chest pain   . Dementia (HCC)   . Depressive disorder, not elsewhere classified     Dr Imogene Burn  . Edema    hands/fingers on occasion  . History of inguinal hernia    right  . Osteoporosis, unspecified   . Other and unspecified hyperlipidemia   . Other psychological or physical stress, not elsewhere classified(V62.89)    Dr Imogene Burn  . Personal history of colonic polyps    Dr Mechele Collin  . Unspecified hypothyroidism   . Unspecified hypothyroidism     Patient Active Problem List   Diagnosis Date Noted  . Altered mental status 03/27/2019  . Dementia (HCC) 03/27/2019  . Chest pain 08/16/2011  . Hyperlipidemia 08/16/2011  . Bradycardia 08/16/2011    Past Surgical History:  Procedure Laterality Date  . HERNIA REPAIR    . TONSILLECTOMY    . TUBAL LIGATION      Prior to Admission medications   Medication Sig Start Date End Date Taking? Authorizing Provider  ammonium lactate (LAC-HYDRIN) 12 % lotion Apply 1 application topically at bedtime.    [provider]  aspirin 81 MG tablet Take 81 mg by mouth  daily.    [provider]  busPIRone (BUSPAR) 5 MG tablet Take 10 mg by mouth 2 (two) times daily.    [provider]  Calcium Carbonate-Vitamin D (CALCIUM 600+D) 600-200 MG-UNIT TABS Take by mouth.    [provider]  calcium elemental as carbonate (TUMS ULTRA 1000) 400 MG tablet Chew 1,000 mg by mouth as needed.    [provider]  Cholecalciferol (VITAMIN D3) 400 UNITS CAPS Take 1 capsule by mouth daily.    [provider]  diphenhydrAMINE (BENADRYL) 50 MG capsule Take 50 mg by mouth every 6 (six) hours as needed.    [provider]  divalproex (DEPAKOTE) 500 MG DR tablet Take 500 mg by mouth 2 (two) times daily.    [provider]  docusate sodium (COLACE) 100 MG capsule Take 100 mg by mouth 2 (two) times daily.    [provider]  donepezil (ARICEPT) 5 MG tablet Take 5 mg by mouth at bedtime.    [provider]  fosfomycin (MONUROL) 3 g PACK Take 3 g by mouth once for 1 dose. Please mix in 8 oz of water, take by mouth once on 04/14/2019 04/14/19 04/14/19  [name], MD  furosemide (LASIX) 20 MG tablet Take 10 mg by mouth.    [provider]  levothyroxine (SYNTHROID, LEVOTHROID) 88 MCG tablet Take 88 mcg by mouth daily.    [provider]  lovastatin (MEVACOR)  20 MG tablet Take 20 mg by mouth at bedtime.    [provider]  memantine (NAMENDA) 5 MG tablet Take 5 mg by mouth at bedtime.    [provider]  Multiple Vitamins-Minerals (CENTRUM SILVER PO) Take by mouth.    [provider]  pravastatin (PRAVACHOL) 40 MG tablet Take 40 mg by mouth daily.    [provider]  risperiDONE (RISPERDAL) 0.25 MG tablet Take 0.25 mg by mouth 3 (three) times daily.    [provider]  traZODone (DESYREL) 150 MG tablet Take 300 mg by mouth at bedtime.     [provider]  vitamin B-12 (CYANOCOBALAMIN) 1000 MCG tablet Take 1,000 mcg by mouth daily.     [provider]    Allergies Ciprocin-fluocin-procin [fluocinolone acetonide], Clarithromycin, Erythromycin, Macrolides and ketolides, Penicillin v potassium, and Penicillins  Family History  Problem Relation Age of Onset  . Heart failure Father        congestive  . Colon cancer Sister   . Hypertension Sister     Social History Social History   Tobacco Use  . Smoking status: Former Smoker    Types: Cigarettes    Quit date: 05/21/1978    Years since quitting: 40.9  . Smokeless tobacco: Never Used  Substance Use Topics  . Alcohol use: No  . Drug use: No    Review of Systems EM caveat ____________________________________________   PHYSICAL EXAM:  VITAL SIGNS: ED Triage Vitals [04/11/19 0825]  Enc Vitals Group     BP      Pulse Rate 72     Resp 12     Temp 98.9 F (37.2 C)     Temp Source Oral     SpO2 97 %     Weight      Height      Head Circumference      Peak Flow      Pain Score      Pain Loc      Pain Edu?      Excl. in GC?     Constitutional: Alert and protecting her airway well.  She will occasionally verbalize words, repeats but thinks that to her.  She moves all extremities. Eyes: Conjunctivae are normal. Head: Atraumatic. Nose: No congestion/rhinnorhea. Mouth/Throat: Mucous membranes are moist. Neck: No stridor.  Cardiovascular: Normal rate, regular rhythm. Grossly normal heart sounds.  Good peripheral circulation. Respiratory: Normal respiratory effort.  No retractions. Lungs CTAB. Gastrointestinal: Soft and nontender. No distention. Musculoskeletal: No lower extremity tenderness nor edema. Neurologic: Occasionally incomprehensible, but primarily will repeat backwards said to her.  No gross focal neurologic deficits are appreciated.  She does move all extremities with equal strength.  No facial droop. Skin:  Skin is warm, dry and intact. No rash noted. Psychiatric: Mood and affect are mostly calm, becomes slightly agitated when moved  about or examined.  ____________________________________________   LABS (all labs ordered are listed, but only abnormal results are displayed)  Labs Reviewed  CBC WITH DIFFERENTIAL/PLATELET - Abnormal; Notable for the following components:      Result Value   RBC 5.16 (*)    Hemoglobin 15.7 (*)    Platelets 136 (*)    Monocytes Absolute 1.4 (*)    All other components within normal limits  URINALYSIS, ROUTINE W REFLEX MICROSCOPIC - Abnormal; Notable for the following components:   Color, Urine YELLOW (*)    APPearance CLOUDY (*)    Ketones, ur 5 (*)  Protein, ur 30 (*)    Leukocytes,Ua MODERATE (*)    WBC, UA >50 (*)    Bacteria, UA RARE (*)    All other components within normal limits  COMPREHENSIVE METABOLIC PANEL - Abnormal; Notable for the following components:   Glucose, Bld 116 (*)    Calcium 8.7 (*)    All other components within normal limits  NOVEL CORONAVIRUS, NAA (HOSP ORDER, SEND-OUT TO REF LAB; TAT 18-24 HRS)  URINE CULTURE  LACTIC ACID, PLASMA  LACTIC ACID, PLASMA  LIPASE, BLOOD   ____________________________________________  EKG  Reviewed enterotomy at 820 Heart rate 79 QRS 99 QTc 450 Normal sinus rhythm, T wave abnormality in inferior slight depressive appearance and inversion in V2  Compared to previous EKG from November 5 appears similar.  No significant changes noted ____________________________________________  RADIOLOGY  Dg Chest Port 1 View  Result Date: 04/11/2019 CLINICAL DATA:  Fever. EXAM: PORTABLE CHEST 1 VIEW COMPARISON:  03/26/2019 FINDINGS: 0816 hours. The lungs are clear without focal pneumonia, edema, pneumothorax or pleural effusion. The cardiopericardial silhouette is within normal limits for size. The visualized bony structures of the thorax are intact. Telemetry leads overlie the chest. IMPRESSION: No active disease. Electronically Signed   By: Kennith Center M.D.   On: 04/11/2019 08:58      ____________________________________________   PROCEDURES  Procedure(s) performed: None  Procedures  Critical Care performed: No  ____________________________________________   INITIAL IMPRESSION / ASSESSMENT AND PLAN / ED COURSE  Pertinent labs & imaging results that were available during my care of the patient were reviewed by me and considered in my medical decision making (see chart for details).   Patient referred for evaluation of possible fever.  Patient does not close her mouth around a oral thermometer, and EMS reports temperature had to be taken axillary.  EMS did report a temperature of 100.4.  Here on rectal temperature though she is afebrile, her vital signs are normal.  Review of her previous notes she appears to be at her cognitive baseline.  Clinical examination careful examination of back, peritoneum, feet arms extremities reveals no source.  She does not appear to have a fever here.  Patient appears to be stable at her baseline, afebrile here on core temp measurement rectally.  At this point I do not see evidence of clear cause for a fever and she is not febrile now.  However, given the seasonal [place] I will check a Covid test given the report of fever at her facility though also it was reported she tested negative this week and she does not have any convincing Covid-like symptomatology noted at this time except for report of a possible fever  We will check urinalysis, basic labs, chest x-ray to evaluate for possible cause of fever though she was not administered any antipyretics and is afebrile now    ----------------------------------------- 10:56 AM on 04/11/2019 -----------------------------------------  Patient will be treated for urinary tract infection, urine culture sent.  Given report of fever and her urinalysis today I will treat her with fosfomycin.  This was reviewed with pharmacy, no noted drug interactions with her listed allergies, will also have a  repeat dose plan for her in 3 days at her memory care.  Discussed case with Lisette Grinder, her emergency contact/decision-maker who is comfortable with this plan and also returning patient to memory care for ongoing monitoring and treatment. ____________________________________________   FINAL CLINICAL IMPRESSION(S) / ED DIAGNOSES  Final diagnoses:  Urinary tract infection, acute  Fever, unspecified  fever cause        Note:  This document was prepared using Dragon voice recognition software and may include unintentional dictation errors       Delman Kitten, MD 04/11/19 1059

## 2019-04-11 NOTE — ED Notes (Signed)
Pt was able to drink medication without difficulty.

## 2019-04-12 LAB — URINE CULTURE
Culture: NO GROWTH
Special Requests: NORMAL

## 2019-04-14 ENCOUNTER — Telehealth: Payer: Self-pay | Admitting: Family Medicine

## 2019-04-14 LAB — NOVEL CORONAVIRUS, NAA (HOSP ORDER, SEND-OUT TO REF LAB; TAT 18-24 HRS): SARS-CoV-2, NAA: NOT DETECTED

## 2019-04-14 NOTE — Telephone Encounter (Signed)
Kristina Brady with [facility] health called and received patient covid test result and request a copy faxed to Fax# [phone]

## 2019-05-05 ENCOUNTER — Emergency Department
Admission: EM | Admit: 2019-05-05 | Discharge: 2019-05-22 | Disposition: E | Payer: Medicare Other | Attending: Emergency Medicine | Admitting: Emergency Medicine

## 2019-05-05 ENCOUNTER — Other Ambulatory Visit: Payer: Self-pay

## 2019-05-05 DIAGNOSIS — Z7189 Other specified counseling: Secondary | ICD-10-CM | POA: Diagnosis not present

## 2019-05-05 DIAGNOSIS — Z20828 Contact with and (suspected) exposure to other viral communicable diseases: Secondary | ICD-10-CM | POA: Diagnosis not present

## 2019-05-05 DIAGNOSIS — Z79899 Other long term (current) drug therapy: Secondary | ICD-10-CM | POA: Insufficient documentation

## 2019-05-05 DIAGNOSIS — J9601 Acute respiratory failure with hypoxia: Secondary | ICD-10-CM | POA: Diagnosis not present

## 2019-05-05 DIAGNOSIS — Z87891 Personal history of nicotine dependence: Secondary | ICD-10-CM | POA: Insufficient documentation

## 2019-05-05 DIAGNOSIS — R4189 Other symptoms and signs involving cognitive functions and awareness: Secondary | ICD-10-CM

## 2019-05-05 DIAGNOSIS — F0390 Unspecified dementia without behavioral disturbance: Secondary | ICD-10-CM | POA: Insufficient documentation

## 2019-05-05 DIAGNOSIS — Z7982 Long term (current) use of aspirin: Secondary | ICD-10-CM | POA: Insufficient documentation

## 2019-05-05 DIAGNOSIS — E039 Hypothyroidism, unspecified: Secondary | ICD-10-CM | POA: Insufficient documentation

## 2019-05-05 DIAGNOSIS — Z515 Encounter for palliative care: Secondary | ICD-10-CM | POA: Diagnosis not present

## 2019-05-05 DIAGNOSIS — R0602 Shortness of breath: Secondary | ICD-10-CM | POA: Diagnosis present

## 2019-05-05 LAB — POC SARS CORONAVIRUS 2 AG: SARS Coronavirus 2 Ag: NEGATIVE

## 2019-05-05 MED ORDER — GLYCOPYRROLATE 0.2 MG/ML IJ SOLN
0.2000 mg | INTRAMUSCULAR | Status: DC | PRN
  Filled 2019-05-05: qty 1

## 2019-05-05 MED ORDER — ONDANSETRON HCL 4 MG/2ML IJ SOLN
4.0000 mg | Freq: Once | INTRAMUSCULAR | Status: AC
  Administered 2019-05-05: 4 mg via INTRAVENOUS
  Filled 2019-05-05: qty 2

## 2019-05-05 MED ORDER — MORPHINE SULFATE (PF) 4 MG/ML IV SOLN
4.0000 mg | Freq: Once | INTRAVENOUS | Status: AC
  Administered 2019-05-05: 04:00:00 4 mg via INTRAVENOUS
  Filled 2019-05-05: qty 1

## 2019-05-05 MED ORDER — POLYVINYL ALCOHOL 1.4 % OP SOLN
1.0000 [drp] | Freq: Four times a day (QID) | OPHTHALMIC | Status: DC | PRN
  Filled 2019-05-05: qty 15

## 2019-05-05 MED ORDER — ACETAMINOPHEN 650 MG RE SUPP: 650.0000 mg | Freq: Four times a day (QID) | RECTAL | Status: DC | PRN

## 2019-05-05 MED ORDER — ONDANSETRON HCL 4 MG/2ML IJ SOLN: 4.0000 mg | Freq: Four times a day (QID) | INTRAMUSCULAR | Status: DC | PRN

## 2019-05-05 MED ORDER — MORPHINE SULFATE (PF) 4 MG/ML IV SOLN
4.0000 mg | Freq: Once | INTRAVENOUS | Status: AC
  Administered 2019-05-05: 07:00:00 4 mg via INTRAVENOUS
  Filled 2019-05-05: qty 1

## 2019-05-05 MED ORDER — ONDANSETRON 4 MG PO TBDP: 4.0000 mg | ORAL_TABLET | Freq: Four times a day (QID) | ORAL | Status: DC | PRN

## 2019-05-05 MED ORDER — BIOTENE DRY MOUTH MT LIQD
15.0000 mL | OROMUCOSAL | Status: DC | PRN
  Filled 2019-05-05: qty 15

## 2019-05-05 MED ORDER — HALOPERIDOL 0.5 MG PO TABS
0.5000 mg | ORAL_TABLET | ORAL | Status: DC | PRN
  Filled 2019-05-05: qty 1

## 2019-05-05 MED ORDER — GLYCOPYRROLATE 1 MG PO TABS
1.0000 mg | ORAL_TABLET | ORAL | Status: DC | PRN
  Filled 2019-05-05: qty 1

## 2019-05-05 MED ORDER — ACETAMINOPHEN 325 MG PO TABS: 650.0000 mg | ORAL_TABLET | Freq: Four times a day (QID) | ORAL | Status: DC | PRN

## 2019-05-05 MED ORDER — MORPHINE SULFATE (PF) 2 MG/ML IV SOLN
1.0000 mg | INTRAVENOUS | Status: DC | PRN
  Administered 2019-05-05: 11:00:00 4 mg via INTRAVENOUS
  Filled 2019-05-05: qty 2

## 2019-05-05 MED ORDER — HALOPERIDOL LACTATE 5 MG/ML IJ SOLN: 0.5000 mg | INTRAMUSCULAR | Status: DC | PRN

## 2019-05-05 MED ORDER — HALOPERIDOL LACTATE 2 MG/ML PO CONC
0.5000 mg | ORAL | Status: DC | PRN
  Filled 2019-05-05: qty 0.3

## 2019-05-22 NOTE — ED Notes (Signed)
PT asystole, no cardiac or respiratory activity noted, pt pronounced by 2 RNs at this time.

## 2019-05-22 NOTE — ED Notes (Signed)
Family at bedside. 

## 2019-05-22 NOTE — ED Notes (Addendum)
Per conversation with Aniceto Boss, NP from palliative care, pt given Morphine per orders and NRB mask removed.  Family remains at bedside, death imminent.  Pt eyes cleaned of matting, NS drops applied.  Will continue to monitor.

## 2019-05-22 NOTE — ED Provider Notes (Signed)
[facility] Emergency Department Provider Note  ____________________________________________  Time seen: Approximately 4:33 AM  I have reviewed the triage vital signs and the nursing notes.   HISTORY  Chief Complaint Shortness of Breath   HPI Kristina Brady is a 74 y.o. female with a history of dementia, hypothyroidism, hyperlipidemia who arrives emergency traffic for respiratory distress.   [facility] coming [facility] started having shortness of breath this evening which became progressively worse.  Patient was found to be hypoxic.  Placed on oxygen and given Solu-Medrol at the nursing home with no improvement when EMS was called.  Upon arrival patient was hypoxic, audible rales and transported on a nonrebreather.  Patient has a MOST form requesting comfort care, DNR/DNI.  I immediately was able to get patient's niece Ms. Parrott on the phone who is patient's healthcare power of attorney.  She confirms the patient is wishes of comfort care only.  She does not wish any other interventions but is okay with oxygen, suctioning, and medications for comfort.  Past Medical History:  Diagnosis Date  . Chest pain   . Dementia (HCC)   . Depressive disorder, not elsewhere classified     Dr Imogene Burn  . Edema    hands/fingers on occasion  . History of inguinal hernia    right  . Osteoporosis, unspecified   . Other and unspecified hyperlipidemia   . Other psychological or physical stress, not elsewhere classified(V62.89)    Dr Imogene Burn  . Personal history of colonic polyps    Dr Mechele Collin  . Unspecified hypothyroidism   . Unspecified hypothyroidism     Patient Active Problem List   Diagnosis Date Noted  . Altered mental status 03/27/2019  . Dementia (HCC) 03/27/2019  . Chest pain 08/16/2011  . Hyperlipidemia 08/16/2011  . Bradycardia 08/16/2011    Past Surgical History:  Procedure Laterality Date  . HERNIA REPAIR    . TONSILLECTOMY    . TUBAL LIGATION       Prior to Admission medications   Medication Sig Start Date End Date Taking? Authorizing Provider  ammonium lactate (LAC-HYDRIN) 12 % lotion Apply 1 application topically at bedtime.    [provider]  aspirin 81 MG tablet Take 81 mg by mouth daily.    [provider]  busPIRone (BUSPAR) 5 MG tablet Take 10 mg by mouth 2 (two) times daily.    [provider]  Calcium Carbonate-Vitamin D (CALCIUM 600+D) 600-200 MG-UNIT TABS Take by mouth.    [provider]  calcium elemental as carbonate (TUMS ULTRA 1000) 400 MG tablet Chew 1,000 mg by mouth as needed.    [provider]  Cholecalciferol (VITAMIN D3) 400 UNITS CAPS Take 1 capsule by mouth daily.    [provider]  diphenhydrAMINE (BENADRYL) 50 MG capsule Take 50 mg by mouth every 6 (six) hours as needed.    [provider]  divalproex (DEPAKOTE) 500 MG DR tablet Take 500 mg by mouth 2 (two) times daily.    [provider]  docusate sodium (COLACE) 100 MG capsule Take 100 mg by mouth 2 (two) times daily.    [provider]  donepezil (ARICEPT) 5 MG tablet Take 5 mg by mouth at bedtime.    [provider]  furosemide (LASIX) 20 MG tablet Take 10 mg by mouth.    [provider]  levothyroxine (SYNTHROID, LEVOTHROID) 88 MCG tablet Take 88 mcg by mouth daily.    [provider]  lovastatin (MEVACOR) 20 MG tablet Take 20 mg by mouth at bedtime.    [provider]  memantine (NAMENDA) 5 MG tablet Take 5 mg by mouth at bedtime.    [provider]  Multiple Vitamins-Minerals (CENTRUM SILVER PO) Take by mouth.    [provider]  pravastatin (PRAVACHOL) 40 MG tablet Take 40 mg by mouth daily.    [provider]  risperiDONE (RISPERDAL) 0.25 MG tablet Take 0.25 mg by mouth 3 (three) times daily.    [provider]  traZODone (DESYREL) 150 MG tablet Take 300 mg by mouth at bedtime.     [provider]  vitamin B-12 (CYANOCOBALAMIN) 1000 MCG tablet Take 1,000 mcg by mouth daily.    [provider]    Allergies Ciprocin-fluocin-procin [fluocinolone acetonide], Clarithromycin, Erythromycin, Macrolides and ketolides, Penicillin v potassium, and Penicillins  Family History  Problem Relation Age of Onset  . Heart failure Father        congestive  . Colon cancer Sister   . Hypertension Sister     Social History Social History   Tobacco Use  . Smoking status: Former Smoker    Types: Cigarettes    Quit date: 05/21/1978    Years since quitting: 40.9  . Smokeless tobacco: Never Used  Substance Use Topics  . Alcohol use: No  . Drug use: No    Review of Systems  Constitutional: Negative for fever. Respiratory: + shortness of breath.  ____________________________________________   PHYSICAL EXAM:  VITAL SIGNS: ED Triage Vitals  Enc Vitals Group     BP Mar 15, 2019 0328 128/80     Pulse Rate Mar 15, 2019 0330 (!) 38     Resp Mar 15, 2019 0328 (!) 31     Temp Mar 15, 2019 0332 98.3 F (36.8 C)     Temp Source Mar 15, 2019 0332 Axillary     SpO2 Mar 15, 2019 0330 (!) 89 %     Weight --      Height --      Head Circumference --      Peak Flow --      Pain Score --      Pain Loc --      Pain Edu? --      Excl. in GC? --     Constitutional: Obtunded, GCS 5, respiratory distress HEENT:      Head: Normocephalic and atraumatic.         Eyes: Pupils reactive      Mouth/Throat: Mucous membranes are dry.  Cardiovascular: Regular rate and rhythm. No murmurs, gallops, or rubs.  Respiratory: Respiratory distress, hypoxic, coarse rhonchi bilaterally Gastrointestinal: Soft, and non distended with positive bowel sounds.  Musculoskeletal: No edema. Upper extremities are cyanotic, normal LE Neurologic: GCS 5, no cough or gag reflex Skin: Skin is warm, dry and intact. No rash noted.  ____________________________________________   LABS (all labs ordered are listed, but only  abnormal results are displayed)  Labs Reviewed  POC SARS CORONAVIRUS 2 AG -  ED  POC SARS CORONAVIRUS 2 AG   ____________________________________________  EKG  ED ECG REPORT I, Nita Sicklearolina Avriel Kandel, the attending physician, personally viewed and interpreted this ECG.  Sinus tachycardia, rate of 114, LAFB, left axis deviation, no ST elevation, diffuse ST depressions. New when compared to prior ____________________________________________  RADIOLOGY  none  ____________________________________________   PROCEDURES  Procedure(s) performed: None Procedures Critical Care performed:  None ____________________________________________   INITIAL IMPRESSION / ASSESSMENT AND PLAN / ED COURSE  74 y.o. female with a history  of dementia, hypothyroidism, hyperlipidemia who arrives emergency traffic for respiratory distress.   Patient arrives in severe respiratory distress with coarse rhonchi and rales, hypoxic with a GCS of 5, no gag or cough reflex. Patient was placed on HFNC to help with PEEP. Fortunately I was able to speak with patient's healthcare power of attorney, her niece Ms. Parrott. I explained to her the patient rigth now is unable to protect her airway and due to her respiratory status and her mental status that we would have to intubate the patient. Ms. Velta Addison confirmed patient's wishes found on MOST form of no desire for intubation or any advanced resuscitative methods.  She agrees with oxygen, suction, and morphine for comfort/ air hunger. She requested to be present at bedside which was approved after her STAT COVID returned negative. Palliative care was consulted for hospice placement.   Clinical Course as of May 04 625  Tue 05/17/2019  0542 Family at bedside. Patient with GCS 3 on supplemental oxygen.    [CV]    Clinical Course User Index [CV] Alfred Levins [place], MD      As part of my medical decision making, I reviewed the following data within the [facility] History obtained from family, Nursing notes reviewed and incorporated, EKG interpreted , A consult was requested and obtained from this/these consultant(s) Palliative care, Notes from prior ED visits and [place] Controlled Substance Database   Please note:  Patient was evaluated in Emergency Department today for the symptoms described in the history of present illness. Patient was evaluated in the context of the global COVID-19 pandemic, which necessitated consideration that the patient might be at risk for infection with the SARS-CoV-2 virus that causes COVID-19. Institutional protocols and algorithms that pertain to the evaluation of patients at risk for COVID-19 are in a state of rapid change based on information released by regulatory bodies including the CDC and federal and state organizations. These policies and algorithms were followed during the patient's care in the ED.  Some ED evaluations and interventions may be delayed as a result of limited staffing during the pandemic.   ____________________________________________   FINAL CLINICAL IMPRESSION(S) / ED DIAGNOSES   Final diagnoses:  Acute respiratory failure with hypoxia ([place])  Unresponsive      NEW MEDICATIONS STARTED DURING THIS VISIT:  ED Discharge Orders    None       Note:  This document was prepared using Dragon voice recognition software and may include unintentional dictation errors.    Rudene Re, MD 05/17/2019 [phone]

## 2019-05-22 NOTE — ED Triage Notes (Signed)
Pt to ED via [facility] from [place]. Per EMS facility noticed around 11:30pm that pt was SOB. Pt SOB continued to increase. Facility placed pt on oxygen and gave solumedrol w/ no improvement.  Per EMS pt had audible rales. EMS placed pt on CPAP and RR decreased. Ems bagged pt twice. EMS states pt is DNR but no papers present.   Upon arrival pt presents with labored breathing and accessory muscle use.

## 2019-05-22 NOTE — ED Notes (Signed)
Pt placed on high flow and NRB. Pt presents with MOST Form, DNR/DNI. MD in contact w/ family. Will keep pt on comfort care.

## 2019-05-22 NOTE — Consult Note (Signed)
Consultation Note Date: 08/27/2018   Patient Name: Kristina Brady  DOB: 01/25/1946  MRN: [id number]  Age / Sex: 74 y.o., female  PCP: Jerl MinaHedrick, James, MD Referring Physician: No att. providers found  Reason for Consultation: Establishing goals of care and Psychosocial/spiritual support  HPI/Patient Profile: 74 y.o. female  with past medical history of dementia, depression, osteoporosis, hypothyroidism, resident of ALF for 18 months, recent transition to SNF for the last 1 month admitted on 08/27/2018 for end-of-life care.   Clinical Assessment and Goals of Care: Kristina Brady is lying quietly on the stretcher in the ED.   She appears to be quite frail and ill. She is surrounded by her family.  Sister and brother, Kristina Brady and Kristina Brady are at bedside along with Merry's daughter Kristina Brady.  Kristina Brady's family share a story of decline over the last 2 years.  They share her struggle with memory loss.  Twin sister, Kristina Brady, states that Kristina Brady would not want to continue to live in this condition.  She is tearful stating tat she has not been able to see her sister since March.    We talk about comfort care, unburdening [name] from uncomfortable treatments.  We talk about medications for comfort.  Family shares that they are ready to remove NRB.  We talk about prognosis with permission.  Family encouraged to stay nearby.    Conference with bedside nursing staff related to patient condition, needs, EOL care.    HCPOA   NEXT OF KIN - sister and brother, Kristina Brady and Kristina Brady are at bedside along with Merry's daughter Kristina Brady.    SUMMARY OF RECOMMENDATIONS   Full comfort care, anticipate hospital death  Code Status/Advance Care Planning:  DNR  Symptom Management:   Morphine for end-of-life care  Palliative Prophylaxis:   Frequent Pain Assessment and Turn Reposition  Additional Recommendations (Limitations, Scope,  Preferences):  Full Comfort Care  Psycho-social/Spiritual:   Desire for further Chaplaincy support:no  Additional Recommendations: Caregiving  Support/Resources and Education on Hospice  Prognosis:   Hours - Days  Discharge Planning: Anticipated Hospital Death      Primary Diagnoses: Present on Admission: **None**   I have reviewed the medical record, interviewed the patient and family, and examined the patient. The following aspects are pertinent.  Past Medical History:  Diagnosis Date  . Chest pain   . Dementia (HCC)   . Depressive disorder, not elsewhere classified     Dr Imogene Burnhen  . Edema    hands/fingers on occasion  . History of inguinal hernia    right  . Osteoporosis, unspecified   . Other and unspecified hyperlipidemia   . Other psychological or physical stress, not elsewhere classified(V62.89)    Dr Imogene Burnhen  . Personal history of colonic polyps    Dr Mechele CollinElliott  . Unspecified hypothyroidism   . Unspecified hypothyroidism    Social History   Socioeconomic History  . Marital status: Widowed    Spouse name: Not on file  . Number of children: Not on file  .  Years of education: Not on file  . Highest education level: Not on file  Occupational History  . Not on file  Tobacco Use  . Smoking status: Former Smoker    Types: Cigarettes    Quit date: 05/21/1978    Years since quitting: 40.9  . Smokeless tobacco: Never Used  Substance and Sexual Activity  . Alcohol use: No  . Drug use: No  . Sexual activity: Not on file  Other Topics Concern  . Not on file  Social History Narrative  . Not on file   Social Determinants of Health   Financial Resource Strain:   . Difficulty of Paying Living Expenses: Not on file  Food Insecurity:   . Worried About Programme researcher, broadcasting/film/video in the Last Year: Not on file  . Ran Out of Food in the Last Year: Not on file  Transportation Needs:   . Lack of Transportation (Medical): Not on file  . Lack of Transportation (Non-Medical):  Not on file  Physical Activity:   . Days of Exercise per Week: Not on file  . Minutes of Exercise per Session: Not on file  Stress:   . Feeling of Stress : Not on file  Social Connections:   . Frequency of Communication with Friends and Family: Not on file  . Frequency of Social Gatherings with Friends and Family: Not on file  . Attends Religious Services: Not on file  . Active Member of Clubs or Organizations: Not on file  . Attends Banker Meetings: Not on file  . Marital Status: Not on file   Family History  Problem Relation Age of Onset  . Heart failure Father        congestive  . Colon cancer Sister   . Hypertension Sister    Scheduled Meds: Continuous Infusions: PRN Meds:.acetaminophen **OR** acetaminophen, antiseptic oral rinse, glycopyrrolate **OR** glycopyrrolate **OR** glycopyrrolate, haloperidol **OR** haloperidol **OR** haloperidol lactate, morphine injection, ondansetron **OR** ondansetron (ZOFRAN) IV, polyvinyl alcohol Medications Prior to Admission:  Prior to Admission medications   Medication Sig Start Date End Date Taking? Authorizing Provider  ammonium lactate (LAC-HYDRIN) 12 % lotion Apply 1 application topically at bedtime.    [provider]  aspirin 81 MG tablet Take 81 mg by mouth daily.    [provider]  busPIRone (BUSPAR) 5 MG tablet Take 10 mg by mouth 2 (two) times daily.    [provider]  Calcium Carbonate-Vitamin D (CALCIUM 600+D) 600-200 MG-UNIT TABS Take by mouth.    [provider]  calcium elemental as carbonate (TUMS ULTRA 1000) 400 MG tablet Chew 1,000 mg by mouth as needed.    [provider]  Cholecalciferol (VITAMIN D3) 400 UNITS CAPS Take 1 capsule by mouth daily.    [provider]  diphenhydrAMINE (BENADRYL) 50 MG capsule Take 50 mg by mouth every 6 (six) hours as needed.    [provider]  divalproex (DEPAKOTE) 500 MG DR tablet Take 500 mg by mouth 2 (two) times  daily.    [provider]  docusate sodium (COLACE) 100 MG capsule Take 100 mg by mouth 2 (two) times daily.    [provider]  donepezil (ARICEPT) 5 MG tablet Take 5 mg by mouth at bedtime.    [provider]  furosemide (LASIX) 20 MG tablet Take 10 mg by mouth.    [provider]  levothyroxine (SYNTHROID, LEVOTHROID) 88 MCG tablet Take 88 mcg by mouth daily.    [provider]  lovastatin (MEVACOR) 20 MG tablet Take 20 mg by mouth at bedtime.    [provider]  memantine (NAMENDA) 5 MG tablet Take 5 mg by mouth at bedtime.    [provider]  Multiple Vitamins-Minerals (CENTRUM SILVER PO) Take by mouth.    [provider]  pravastatin (PRAVACHOL) 40 MG tablet Take 40 mg by mouth daily.    [provider]  risperiDONE (RISPERDAL) 0.25 MG tablet Take 0.25 mg by mouth 3 (three) times daily.    [provider]  traZODone (DESYREL) 150 MG tablet Take 300 mg by mouth at bedtime.     [provider]  vitamin B-12 (CYANOCOBALAMIN) 1000 MCG tablet Take 1,000 mcg by mouth daily.    [provider]   Allergies  Allergen Reactions  . Ciprocin-Fluocin-Procin [Fluocinolone Acetonide]   . Clarithromycin   . Erythromycin Other (See Comments)  . Macrolides And Ketolides   . Penicillin V Potassium Hives  . Penicillins    Review of Systems  Unable to perform ROS: Acuity of condition    Physical Exam Vitals and nursing note reviewed.  Constitutional:      General: She is not in acute distress.    Appearance: She is ill-appearing.     Comments: Appears acutely/chronically ill and frail.  Does not respond to voice or touch.  Flushed  Cardiovascular:     Rate and Rhythm: Normal rate.  Pulmonary:     Effort: Pulmonary effort is normal. No accessory muscle usage or respiratory distress.  Abdominal:     Palpations: Abdomen is soft.  Skin:    General: Skin is warm and dry.     Comments:  flushed  Neurological:     Comments: Known memory loss, unresponsive.      Vital Signs: BP 91/71 (BP Location: Left Arm)   Pulse (!) 109   Temp 98.3 F (36.8 C) (Axillary)   Resp (!) 22   SpO2 (!) 73%          SpO2: SpO2: (!) 73 % O2 Device:SpO2: (!) 73 % O2 Flow Rate: .O2 Flow Rate (L/min): 15 L/min  IO: Intake/output summary: No intake or output data in the 24 hours ending 05/13/2019 1152  LBM:   Baseline Weight:   Most recent weight:       Palliative Assessment/Data:   Flowsheet Rows     Most Recent Value  Intake Tab  Referral Department  Hospitalist  Unit at Time of Referral  ER  Palliative Care Primary Diagnosis  Pulmonary  Date Notified  05-13-2019  Palliative Care Type  New Palliative care  Reason for referral  Clarify Goals of Care, End of Life Care Assistance  Date of Admission  May 13, 2019  Date first seen by Palliative Care  2019/05/13  # of days Palliative referral response time  0 Day(s)  # of days IP prior to Palliative referral  0  Clinical Assessment  Palliative Performance Scale Score  10%  Pain Max last 24 hours  Not able to report  Pain Min Last 24 hours  Not able to report  Dyspnea Max Last 24 Hours  Not able to report  Dyspnea Min Last 24 hours  Not able to report  Psychosocial & Spiritual Assessment  Palliative Care Outcomes      Time In: 1050 Time Out: 1205 Time Total: 70 minutes Greater than 50%  of this time was spent counseling and coordinating care related to the above assessment and plan.  Signed by:  Katheran Awe, NP   Please contact Palliative Medicine Team phone at [phone] for questions and concerns.  For individual provider: See Loretha Stapler

## 2019-05-22 NOTE — ED Notes (Signed)
Post mortem care complete, pt ready for transport at this time.

## 2019-05-22 NOTE — ED Notes (Signed)
Care of pt assumed, family at bedside, RT to remove high flow [place], NRB to remain in place.  Pt administered ordered medications.  Family denies needs at this time.  Will continue to monitor.

## 2019-05-22 NOTE — Progress Notes (Signed)
Patient unable to maintain SATS on HHFNC 50L 95%. Per MD verbal, NRBMask placed on patient in addition to [place].

## 2019-05-22 DEATH — deceased

## 2020-11-18 IMAGING — MR MR HEAD W/O CM
10 series · 48 of 48 positions shown · non-contrast
Comparison: Prior head CT from 03/26/2019 as well as previous MRI
from 09/05/2012.

CLINICAL DATA: Initial evaluation for acute altered mental status,
fall.

EXAM:
MRI HEAD WITHOUT CONTRAST
TECHNIQUE: Multiplanar, multiecho pulse sequences of the brain and surrounding
structures were obtained without intravenous contrast.

[Series 3: ax dwi_tracew · axial · 3.0mm · 1.31mm/px · z∈[-131,+23]mm · 8 of 48 slices shown]
[im 1/48]
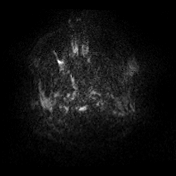
[im 7/48]
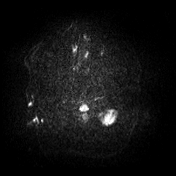
[im 14/48]
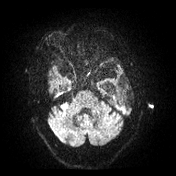
[im 21/48]
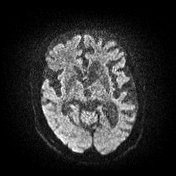
[im 27/48]
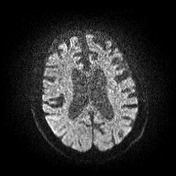
[im 34/48]
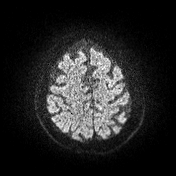
[im 41/48]
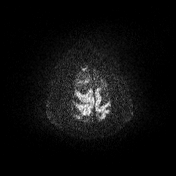
[im 48/48]
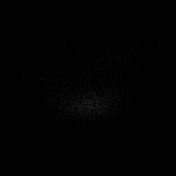

[Series 4: ax dwi_adc · axial · 3.0mm · 1.31mm/px · z∈[-131,+23]mm · 7 of 48 slices shown]
[im 1/48]
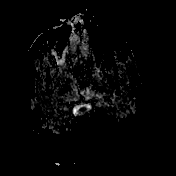
[im 8/48]
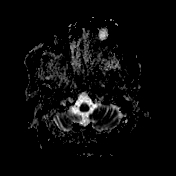
[im 16/48]
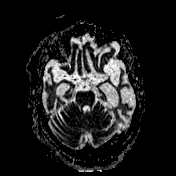
[im 24/48]
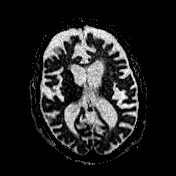
[im 32/48]
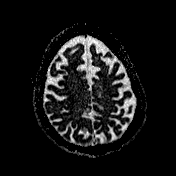
[im 40/48]
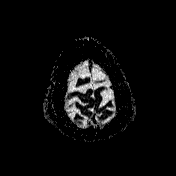
[im 48/48]
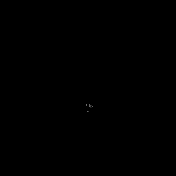

[Series 5: cor dwi_tracew · coronal · 5.0mm · 1.31mm/px · 5 of 38 slices shown]
[im 1/38]
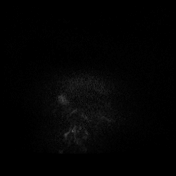
[im 10/38]
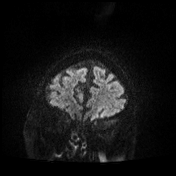
[im 19/38]
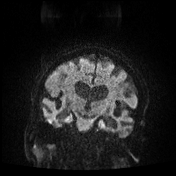
[im 28/38]
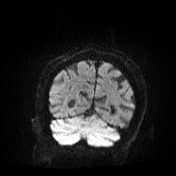
[im 38/38]
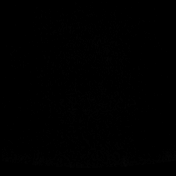

[Series 6: cor dwi_adc · coronal · 5.0mm · 1.31mm/px · 5 of 37 slices shown]
[im 1/37]
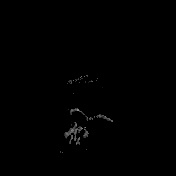
[im 10/37]
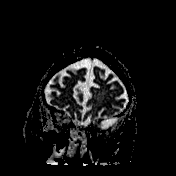
[im 19/37]
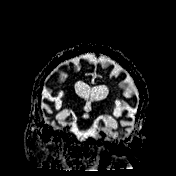
[im 28/37]
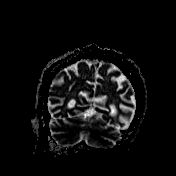
[im 37/37]
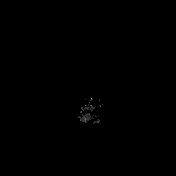

[Series 7: T1 · sagittal · 5.0mm · 0.94mm/px · 3 of 23 slices shown (1 of 2)]
[im 1/23]
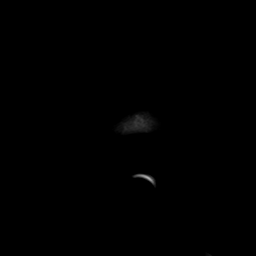
[im 12/23]
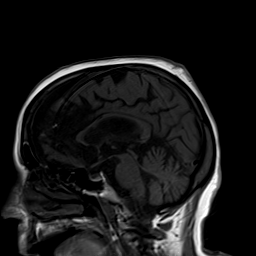
[im 23/23]
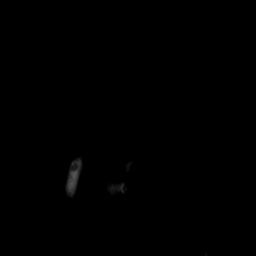

[Series 8: T2 · axial · 5.0mm · 0.45mm/px · z∈[-161,-6]mm · 4 of 27 slices shown (1 of 2)]
[im 1/27]
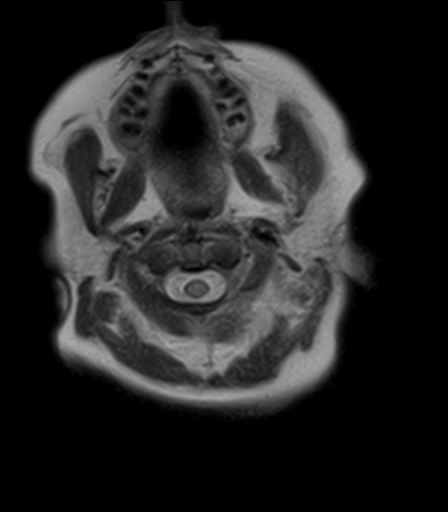
[im 9/27]
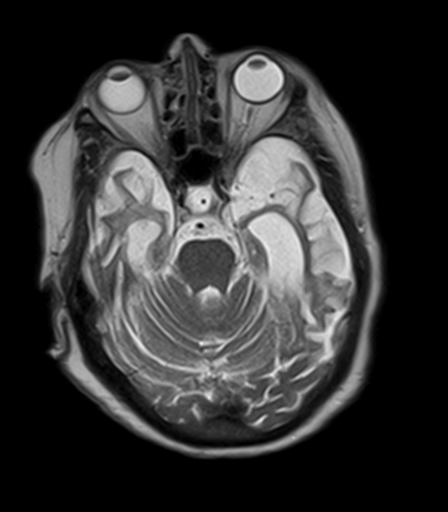
[im 18/27]
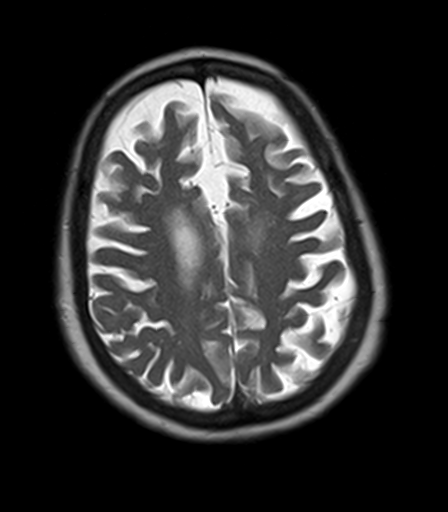
[im 27/27]
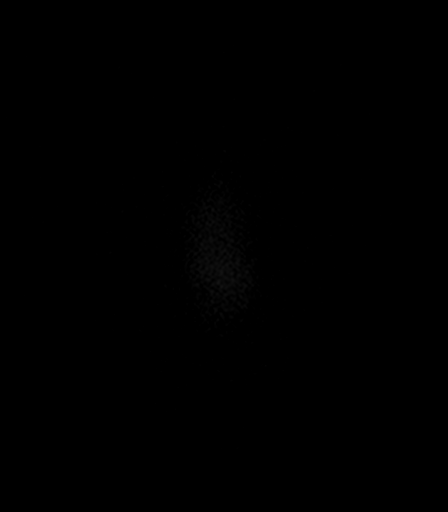

[Series 9: T2-star · axial · 5.0mm · 0.45mm/px · z∈[-161,-6]mm · 4 of 27 slices shown]
[im 1/27]
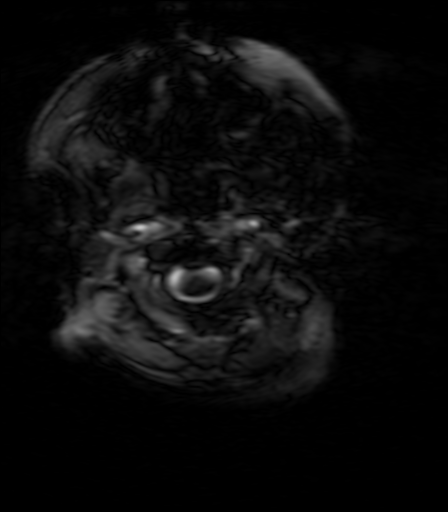
[im 9/27]
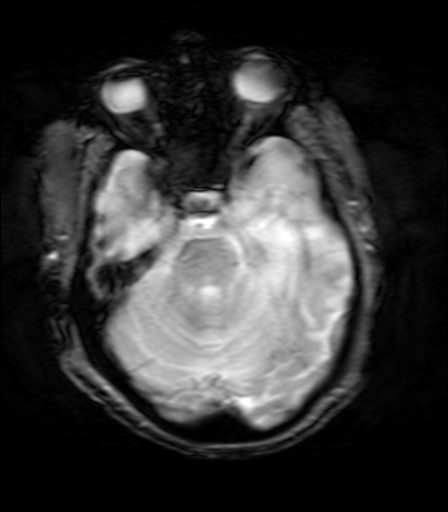
[im 18/27]
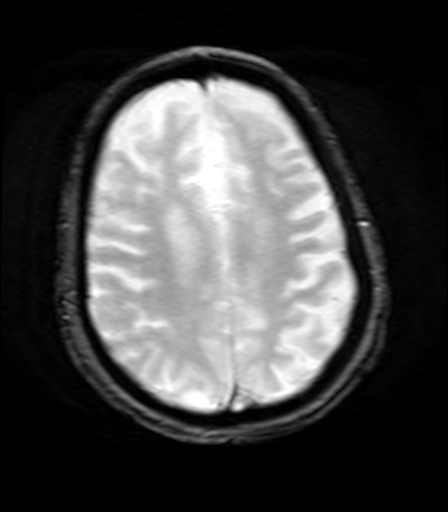
[im 27/27]
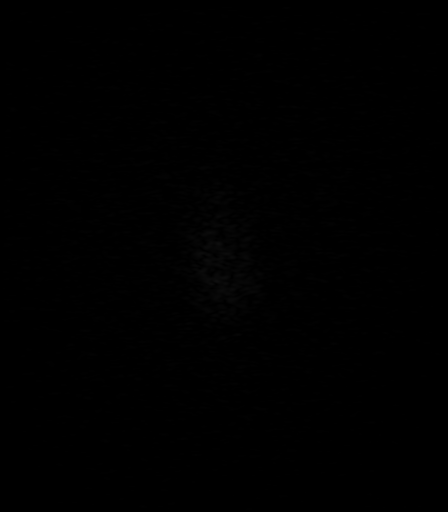

[Series 10: FLAIR · axial · 5.0mm · 1.20mm/px · z∈[-160,-5]mm · 4 of 27 slices shown]
[im 1/27]
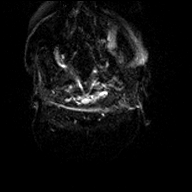
[im 9/27]
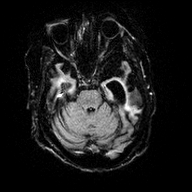
[im 18/27]
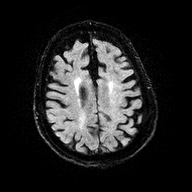
[im 27/27]
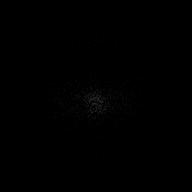

[Series 11: T1 · axial · 5.0mm · 0.90mm/px · z∈[-161,-6]mm · 4 of 27 slices shown (2 of 2)]
[im 1/27]
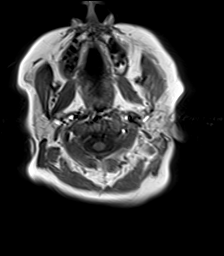
[im 9/27]
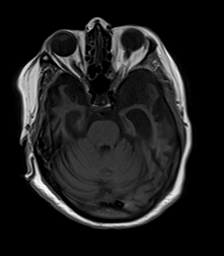
[im 18/27]
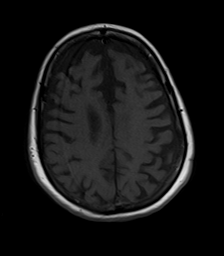
[im 27/27]
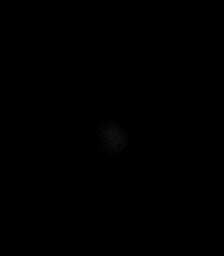

[Series 12: T2 · coronal · 5.0mm · 0.45mm/px · 4 of 31 slices shown (2 of 2)]
[im 1/31]
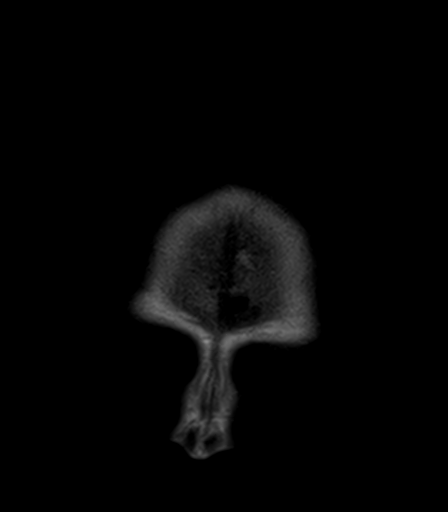
[im 11/31]
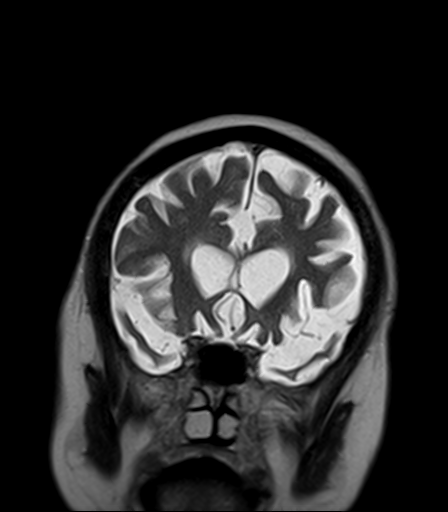
[im 21/31]
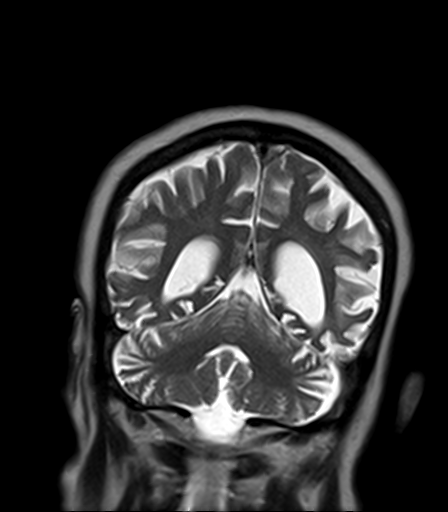
[im 31/31]
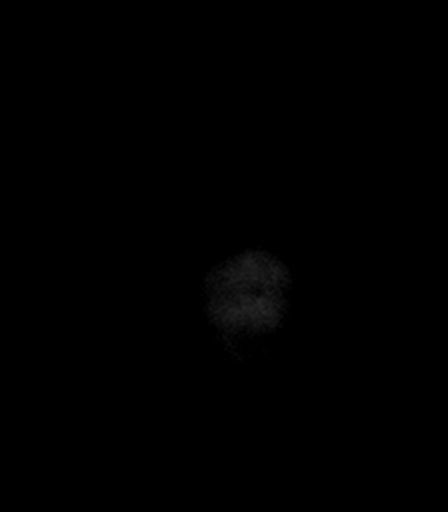

[48 of 48 positions shown; findings below may reference images not displayed]

FINDINGS: Brain: Advanced temporal lobe predominant cerebral atrophy. Mild
chronic microvascular ischemic changes present within the
periventricular and deep white matter both cerebral hemispheres.

No abnormal foci of restricted diffusion to suggest acute or
subacute ischemia. Gray-white matter differentiation maintained. No
encephalomalacia to suggest chronic cortical infarction. No foci of
susceptibility artifact to suggest acute or chronic intracranial
hemorrhage.

No mass lesion, midline shift or mass effect. Diffuse ventricular
prominence related global parenchymal volume loss without
hydrocephalus. No extra-axial fluid collection. Pituitary gland
within normal limits.

Vascular: Major intracranial vascular flow voids grossly maintained.

Skull and upper cervical spine: Craniocervical junction within
normal limits. Bone marrow signal intensity normal. T2 hypointense
well-circumscribed nodule at the left frontal scalp noted, of
doubtful significance. Scalp soft tissues demonstrate no acute
finding.

Sinuses/Orbits: Globes and orbital soft tissues within normal
limits. Mild mucosal thickening noted within the right maxillary
sinus. Paranasal sinuses are otherwise clear. Small right mastoid
effusion noted, of doubtful significance.

Other: None.
IMPRESSION: 1. No acute intracranial abnormality.
2. Advanced temporal lobe predominant cerebral atrophy with mild
chronic small vessel ischemic disease.
